# Patient Record
Sex: Male | Born: 2000
Health system: Southern US, Community
[De-identification: ages and names within clinical notes are randomized; demographics above are authoritative.]

---
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
## 2018-09-04 ENCOUNTER — Encounter: Payer: Self-pay | Admitting: Family Medicine

## 2018-09-04 ENCOUNTER — Ambulatory Visit (INDEPENDENT_AMBULATORY_CARE_PROVIDER_SITE_OTHER): Payer: BLUE CROSS/BLUE SHIELD | Admitting: Family Medicine

## 2018-09-04 VITALS — BP 125/73 | HR 69 | Temp 98.8°F | Resp 14

## 2018-09-04 DIAGNOSIS — R112 Nausea with vomiting, unspecified: Secondary | ICD-10-CM

## 2018-09-04 LAB — POCT INFLUENZA A/B
INFLUENZA B, POC: NEGATIVE
Influenza A, POC: NEGATIVE

## 2018-09-04 MED ORDER — ONDANSETRON 4 MG PO TBDP
4.0000 mg | ORAL_TABLET | Freq: Three times a day (TID) | ORAL | 0 refills | Status: DC | PRN
Start: 2018-09-04 — End: 2018-10-02

## 2018-09-04 NOTE — Progress Notes (Signed)
Patient presents today with symptoms of vomiting at 2 AM and nausea.  Patient states that he had pizza with chicken on it at around 7 PM.  His roommate also had the same pizza but does not have symptoms.  Patient also admits to a low-grade fever.  He has not drank anything today.  He has some fatigue.  He admits to some generalized abdominal pain at a level of 3 out of 10.  He has been able to pass gas.  He denies any diarrhea.  He still has his appendix.  He denies any upper respiratory symptoms.  He has not traveled outside of the country recently.  He denies any alcohol use yesterday.  ROS: Negative except mentioned above. Vitals as per Epic. GENERAL: NAD HEENT: no pharyngeal erythema, no exudate, no erythema of TMs, no cervical LAD RESP: CTA B CARD: RRR ABD: Increased bowel sounds, soft, no significant tenderness on exam, no rebound or guarding, no flank tenderness NEURO: CN II-XII grossly intact   A/P: Nausea/Vomiting - likely viral etiology, flu test negative in the office, discussed symptoms to look for regarding appendicitis, seek medical attention if any worsening symptoms as discussed, may develop diarrhea, Zofran prescribed, rest, hydration, BRAT Diet if diarrhea develops, no athletic activity or class until symptoms have resolved, will discuss above with athletic trainer.

## 2018-10-02 ENCOUNTER — Ambulatory Visit (INDEPENDENT_AMBULATORY_CARE_PROVIDER_SITE_OTHER): Payer: BLUE CROSS/BLUE SHIELD | Admitting: Family Medicine

## 2018-10-02 VITALS — BP 143/77 | HR 90 | Temp 98.7°F | Resp 14

## 2018-10-02 DIAGNOSIS — R112 Nausea with vomiting, unspecified: Secondary | ICD-10-CM | POA: Diagnosis not present

## 2018-10-02 MED ORDER — ONDANSETRON 4 MG PO TBDP: 4.0000 mg | ORAL_TABLET | Freq: Three times a day (TID) | ORAL | 0 refills | Status: AC | PRN

## 2018-10-02 NOTE — Progress Notes (Addendum)
Patient presents today with symptoms of nausea and vomiting for 1 day.  Patient denies any diarrhea.  He denies any significant abdominal pain.  Denies any fever, upper respiratory symptoms, sore throat, CP, SOB, urinary symptoms.  He denies any alcohol use or any other illicit drug use.  He denies any head injury or headaches. Patient was seen here for similar symptoms a month ago.  Patient states that his symptoms resolved after that and he has had no problems until yesterday.  He denies any blood in his stool or melena.  He denies any significant weight loss.  He has been able to drink some fluids today.  He denies any acid reflux symptoms.  He is unsure of any sick contacts.  He denies any recent travel outside of the country the last month.  He states his last bowel movement was earlier today.  ROS: Negative except mentioned above. Vitals as per Epic. GENERAL: NAD HEENT: no significant pharyngeal erythema, no exudate, no erythema of TMs, no cervical LAD RESP: CTA B CARD: RRR ABD: Positive bowel sounds, nontender, no rebound or guarding appreciated, no flank tenderness NEURO: CN II-XII grossly intact  A/P: Nausea/Vomiting -unclear as to the etiology possibly viral, will prescribe Zofran, rest, hydration, monitor symptoms closely, if any worsening symptoms as discussed go to the ER as mentioned, if patient continues to have GI problems would recommend referral to GI, this was discussed with the patient and AT, discussed BRAT Diet if diarrhea starts, discussed types of beverages to drink, wash hands frequently, no athletic activity or class until symptoms have resolved and patient has drank and eaten normally for 24 hours, patient addresses understanding and will seek medical attention if any further problems.  Discussed above with athletic trainer.  Would also like athletic trainer to talk to patient about whether he is nervous or anxious about football.  Athletic trainer will follow-up with me after  he is spoken to the patient.

## 2018-10-09 ENCOUNTER — Encounter: Payer: Self-pay | Admitting: Family Medicine

## 2018-10-09 ENCOUNTER — Ambulatory Visit (INDEPENDENT_AMBULATORY_CARE_PROVIDER_SITE_OTHER): Payer: BLUE CROSS/BLUE SHIELD | Admitting: Family Medicine

## 2018-10-09 VITALS — BP 115/61 | HR 57 | Temp 98.1°F | Resp 14

## 2018-10-09 DIAGNOSIS — F418 Other specified anxiety disorders: Secondary | ICD-10-CM

## 2018-10-09 NOTE — Progress Notes (Signed)
Patient presents today with symptoms of nausea and vomiting prior to football practice for the past week.  Patient initially was unsure why he was experiencing this.  He has now noticed that it is due to his anxiety related to football practice.  He admits that he does not have the symptoms when he has weight room activities.  He feels the pressure to perform better than last year.  He denies that this pressure is from his family or coaches.  He admits that he also had similar type symptoms when he first came to [place] and started college.  He does have some anxiety when speaking in front of large groups but not causing nausea or vomiting.  He denies having symptoms of chest pain or palpitations. Patient denies any suicidal or homicidal ideations.  He denies any illicit drug use.  He admits that he did speak to his parents about his anxiety and symptoms.  He states that they were supportive.  He also knows that the athletic trainer spoke to his position coach and he is aware of the situation as well.  Patient states that he is fine with his parents and his position coach knowing about the situation.  He is interested in speaking to a therapist and/or psychiatrist.  He would consider taking medication if prescribed.  ROS: Negative except mentioned above. Vitals as per Epic. GENERAL: NAD, tearful at times when talking NEURO: CN II-XII grossly intact  A/P: Anxiety primarily related to football performance - referral was made to NP (Psychiatry), patient did sign release of information, patient given card with phone number to contact counseling services to make appointment, inform patient that there will be a cost to meet with the NP but not with a counselor on campus, patient addresses understanding of this, patient also given number for crisis line if needed, patient will go to practice if he feels comfortable for now, discussed breathing exercises, Zofran prn, will follow-up with me after he has met with the NP.   Will discuss above with athletic trainer.

## 2019-01-25 DIAGNOSIS — F411 Generalized anxiety disorder: Secondary | ICD-10-CM | POA: Diagnosis not present

## 2019-02-01 ENCOUNTER — Other Ambulatory Visit: Payer: Self-pay | Admitting: *Deleted

## 2019-02-01 DIAGNOSIS — Z20822 Contact with and (suspected) exposure to covid-19: Secondary | ICD-10-CM

## 2019-02-05 ENCOUNTER — Other Ambulatory Visit: Payer: Self-pay

## 2019-02-05 DIAGNOSIS — Z20822 Contact with and (suspected) exposure to covid-19: Secondary | ICD-10-CM

## 2019-02-05 DIAGNOSIS — R6889 Other general symptoms and signs: Secondary | ICD-10-CM | POA: Diagnosis not present

## 2019-02-10 LAB — NOVEL CORONAVIRUS, NAA: SARS-CoV-2, NAA: NOT DETECTED

## 2019-03-01 ENCOUNTER — Telehealth (INDEPENDENT_AMBULATORY_CARE_PROVIDER_SITE_OTHER): Payer: BLUE CROSS/BLUE SHIELD | Admitting: Family Medicine

## 2019-03-01 DIAGNOSIS — F418 Other specified anxiety disorders: Secondary | ICD-10-CM

## 2019-03-02 NOTE — Progress Notes (Signed)
Virtual visit agreed to.  Patient states that he has started to have anxiety about football again. He had similar symptoms earlier this year and started seeing the Psy. NP w/ the counseling center. He was prescribed Mirtazapine. He ran out of the medication over the summer and recently got it refilled again by a provider back home. He has been on it again for about 2-3 weeks. He has noticed vomiting in the mornings again before going to practice. He states he is nervous and anxious about practice. At times he feels his heart racing at practiced.  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 He feels pressure from his family to do this as well. He denies any aspirations to play professionally. He denies any suicidal or homicidal ideations. He admits that he wants to play football and isn't just doing it for his family.   No exam.  Makes good eye contact during virtual visit.  A/P: Anxiety related to Football - recommended patient reach out to Psy. NP by email to see when she will be back in the office or whether he can have a virtual visit with her. If that is not possible he is supposed to let me know by email. I will refer him to another psychiatrist and/or therapist if needed. Patient states that he will get back to me after touching base with NP. Use crisis phone number or 911 if needed. Encouraged him to try some breathing exercises in the morning. Encouraged him also to stay hydrated. Discussed with AT and advised to pull athlete from activity if vomiting on the field or other problems.

## 2019-03-12 ENCOUNTER — Other Ambulatory Visit: Payer: Self-pay

## 2019-03-12 DIAGNOSIS — Z20822 Contact with and (suspected) exposure to covid-19: Secondary | ICD-10-CM

## 2019-03-13 LAB — NOVEL CORONAVIRUS, NAA: SARS-CoV-2, NAA: NOT DETECTED

## 2019-04-17 ENCOUNTER — Other Ambulatory Visit: Payer: Self-pay

## 2019-04-17 DIAGNOSIS — R6889 Other general symptoms and signs: Secondary | ICD-10-CM | POA: Diagnosis not present

## 2019-04-17 DIAGNOSIS — Z20822 Contact with and (suspected) exposure to covid-19: Secondary | ICD-10-CM

## 2019-04-19 ENCOUNTER — Other Ambulatory Visit: Payer: Self-pay

## 2019-04-19 DIAGNOSIS — Z20822 Contact with and (suspected) exposure to covid-19: Secondary | ICD-10-CM

## 2019-04-19 DIAGNOSIS — R6889 Other general symptoms and signs: Secondary | ICD-10-CM | POA: Diagnosis not present

## 2019-04-19 LAB — NOVEL CORONAVIRUS, NAA: SARS-CoV-2, NAA: NOT DETECTED

## 2019-04-20 LAB — NOVEL CORONAVIRUS, NAA: SARS-CoV-2, NAA: NOT DETECTED

## 2019-04-23 ENCOUNTER — Other Ambulatory Visit: Payer: Self-pay

## 2019-04-23 DIAGNOSIS — Z20822 Contact with and (suspected) exposure to covid-19: Secondary | ICD-10-CM

## 2019-04-25 LAB — NOVEL CORONAVIRUS, NAA: SARS-CoV-2, NAA: NOT DETECTED

## 2019-05-21 ENCOUNTER — Other Ambulatory Visit: Payer: Self-pay

## 2019-05-21 DIAGNOSIS — Z20822 Contact with and (suspected) exposure to covid-19: Secondary | ICD-10-CM

## 2019-05-23 LAB — NOVEL CORONAVIRUS, NAA: SARS-CoV-2, NAA: NOT DETECTED

## 2019-07-20 DIAGNOSIS — Z23 Encounter for immunization: Secondary | ICD-10-CM | POA: Diagnosis not present

## 2019-08-08 DIAGNOSIS — Z20822 Contact with and (suspected) exposure to covid-19: Secondary | ICD-10-CM | POA: Diagnosis not present

## 2019-08-12 DIAGNOSIS — Z20822 Contact with and (suspected) exposure to covid-19: Secondary | ICD-10-CM | POA: Diagnosis not present

## 2019-08-22 DIAGNOSIS — Z20822 Contact with and (suspected) exposure to covid-19: Secondary | ICD-10-CM | POA: Diagnosis not present

## 2019-08-26 DIAGNOSIS — Z20822 Contact with and (suspected) exposure to covid-19: Secondary | ICD-10-CM | POA: Diagnosis not present

## 2019-08-30 DIAGNOSIS — Z20822 Contact with and (suspected) exposure to covid-19: Secondary | ICD-10-CM | POA: Diagnosis not present

## 2019-09-02 DIAGNOSIS — Z20822 Contact with and (suspected) exposure to covid-19: Secondary | ICD-10-CM | POA: Diagnosis not present

## 2019-09-04 DIAGNOSIS — Z20822 Contact with and (suspected) exposure to covid-19: Secondary | ICD-10-CM | POA: Diagnosis not present

## 2019-09-06 DIAGNOSIS — Z20822 Contact with and (suspected) exposure to covid-19: Secondary | ICD-10-CM | POA: Diagnosis not present

## 2019-09-09 DIAGNOSIS — Z20822 Contact with and (suspected) exposure to covid-19: Secondary | ICD-10-CM | POA: Diagnosis not present

## 2019-09-11 DIAGNOSIS — Z20822 Contact with and (suspected) exposure to covid-19: Secondary | ICD-10-CM | POA: Diagnosis not present

## 2019-09-13 DIAGNOSIS — Z20822 Contact with and (suspected) exposure to covid-19: Secondary | ICD-10-CM | POA: Diagnosis not present

## 2019-09-16 DIAGNOSIS — Z20822 Contact with and (suspected) exposure to covid-19: Secondary | ICD-10-CM | POA: Diagnosis not present

## 2019-09-18 DIAGNOSIS — Z20822 Contact with and (suspected) exposure to covid-19: Secondary | ICD-10-CM | POA: Diagnosis not present

## 2019-09-21 DIAGNOSIS — Z20822 Contact with and (suspected) exposure to covid-19: Secondary | ICD-10-CM | POA: Diagnosis not present

## 2019-09-23 DIAGNOSIS — Z20822 Contact with and (suspected) exposure to covid-19: Secondary | ICD-10-CM | POA: Diagnosis not present

## 2019-09-25 DIAGNOSIS — Z20822 Contact with and (suspected) exposure to covid-19: Secondary | ICD-10-CM | POA: Diagnosis not present

## 2019-09-28 DIAGNOSIS — Z20822 Contact with and (suspected) exposure to covid-19: Secondary | ICD-10-CM | POA: Diagnosis not present

## 2019-09-30 DIAGNOSIS — Z20822 Contact with and (suspected) exposure to covid-19: Secondary | ICD-10-CM | POA: Diagnosis not present

## 2019-10-02 DIAGNOSIS — Z20822 Contact with and (suspected) exposure to covid-19: Secondary | ICD-10-CM | POA: Diagnosis not present

## 2019-10-04 DIAGNOSIS — Z20822 Contact with and (suspected) exposure to covid-19: Secondary | ICD-10-CM | POA: Diagnosis not present

## 2019-10-07 DIAGNOSIS — Z20822 Contact with and (suspected) exposure to covid-19: Secondary | ICD-10-CM | POA: Diagnosis not present

## 2019-10-09 DIAGNOSIS — Z20822 Contact with and (suspected) exposure to covid-19: Secondary | ICD-10-CM | POA: Diagnosis not present

## 2019-10-11 DIAGNOSIS — Z20822 Contact with and (suspected) exposure to covid-19: Secondary | ICD-10-CM | POA: Diagnosis not present

## 2019-10-14 DIAGNOSIS — Z20822 Contact with and (suspected) exposure to covid-19: Secondary | ICD-10-CM | POA: Diagnosis not present

## 2019-10-16 DIAGNOSIS — Z20822 Contact with and (suspected) exposure to covid-19: Secondary | ICD-10-CM | POA: Diagnosis not present

## 2019-10-19 DIAGNOSIS — Z20822 Contact with and (suspected) exposure to covid-19: Secondary | ICD-10-CM | POA: Diagnosis not present

## 2019-10-21 DIAGNOSIS — Z20822 Contact with and (suspected) exposure to covid-19: Secondary | ICD-10-CM | POA: Diagnosis not present

## 2019-10-23 DIAGNOSIS — Z20822 Contact with and (suspected) exposure to covid-19: Secondary | ICD-10-CM | POA: Diagnosis not present

## 2019-10-26 DIAGNOSIS — Z20822 Contact with and (suspected) exposure to covid-19: Secondary | ICD-10-CM | POA: Diagnosis not present

## 2019-10-28 DIAGNOSIS — Z20822 Contact with and (suspected) exposure to covid-19: Secondary | ICD-10-CM | POA: Diagnosis not present

## 2019-10-30 DIAGNOSIS — Z20822 Contact with and (suspected) exposure to covid-19: Secondary | ICD-10-CM | POA: Diagnosis not present

## 2019-11-01 DIAGNOSIS — Z20822 Contact with and (suspected) exposure to covid-19: Secondary | ICD-10-CM | POA: Diagnosis not present

## 2019-11-06 DIAGNOSIS — Z20822 Contact with and (suspected) exposure to covid-19: Secondary | ICD-10-CM | POA: Diagnosis not present

## 2019-11-13 DIAGNOSIS — Z20822 Contact with and (suspected) exposure to covid-19: Secondary | ICD-10-CM | POA: Diagnosis not present

## 2019-11-20 DIAGNOSIS — Z20822 Contact with and (suspected) exposure to covid-19: Secondary | ICD-10-CM | POA: Diagnosis not present

## 2019-11-22 DIAGNOSIS — Z20822 Contact with and (suspected) exposure to covid-19: Secondary | ICD-10-CM | POA: Diagnosis not present

## 2019-11-27 DIAGNOSIS — Z20822 Contact with and (suspected) exposure to covid-19: Secondary | ICD-10-CM | POA: Diagnosis not present

## 2019-12-05 DIAGNOSIS — Z20822 Contact with and (suspected) exposure to covid-19: Secondary | ICD-10-CM | POA: Diagnosis not present

## 2019-12-12 DIAGNOSIS — Z20822 Contact with and (suspected) exposure to covid-19: Secondary | ICD-10-CM | POA: Diagnosis not present

## 2020-01-02 DIAGNOSIS — Z20822 Contact with and (suspected) exposure to covid-19: Secondary | ICD-10-CM | POA: Diagnosis not present

## 2020-02-14 DIAGNOSIS — F411 Generalized anxiety disorder: Secondary | ICD-10-CM | POA: Diagnosis not present

## 2020-06-15 DIAGNOSIS — S060X1D Concussion with loss of consciousness of 30 minutes or less, subsequent encounter: Secondary | ICD-10-CM | POA: Diagnosis not present

## 2020-06-16 DIAGNOSIS — S060X1D Concussion with loss of consciousness of 30 minutes or less, subsequent encounter: Secondary | ICD-10-CM | POA: Diagnosis not present

## 2020-06-17 DIAGNOSIS — S060X1D Concussion with loss of consciousness of 30 minutes or less, subsequent encounter: Secondary | ICD-10-CM | POA: Diagnosis not present

## 2020-06-18 DIAGNOSIS — S060X1D Concussion with loss of consciousness of 30 minutes or less, subsequent encounter: Secondary | ICD-10-CM | POA: Diagnosis not present
